# Patient Record
Sex: Male | Born: 1995 | Race: Black or African American | Hispanic: No | Marital: Single
Health system: Southern US, Community
[De-identification: ages and names within clinical notes are randomized; demographics above are authoritative.]

---

## 2016-12-28 ENCOUNTER — Emergency Department (HOSPITAL_COMMUNITY): Payer: Self-pay

## 2016-12-28 ENCOUNTER — Emergency Department (HOSPITAL_COMMUNITY)
Admission: EM | Admit: 2016-12-28 | Discharge: 2016-12-28 | Disposition: A | Discharge status: Home / Self Care | Payer: Self-pay | Attending: Emergency Medicine | Admitting: Emergency Medicine

## 2016-12-28 ENCOUNTER — Encounter (HOSPITAL_COMMUNITY): Payer: Self-pay

## 2016-12-28 DIAGNOSIS — Y939 Activity, unspecified: Secondary | ICD-10-CM | POA: Insufficient documentation

## 2016-12-28 DIAGNOSIS — W540XXA Bitten by dog, initial encounter: Secondary | ICD-10-CM | POA: Insufficient documentation

## 2016-12-28 DIAGNOSIS — Y929 Unspecified place or not applicable: Secondary | ICD-10-CM | POA: Insufficient documentation

## 2016-12-28 DIAGNOSIS — Y999 Unspecified external cause status: Secondary | ICD-10-CM | POA: Insufficient documentation

## 2016-12-28 DIAGNOSIS — S51852A Open bite of left forearm, initial encounter: Secondary | ICD-10-CM | POA: Insufficient documentation

## 2016-12-28 MED ORDER — HYDROCODONE-ACETAMINOPHEN 5-325 MG PO TABS
1.0000 | ORAL_TABLET | Freq: Once | ORAL | Status: AC
  Administered 2016-12-28: 1 via ORAL
  Filled 2016-12-28: qty 1

## 2016-12-28 MED ORDER — HYDROCODONE-ACETAMINOPHEN 5-325 MG PO TABS: 1.0000 | ORAL_TABLET | ORAL | 0 refills | Status: AC | PRN

## 2016-12-28 MED ORDER — AMOXICILLIN-POT CLAVULANATE 875-125 MG PO TABS: 1.0000 | ORAL_TABLET | Freq: Two times a day (BID) | ORAL | 0 refills | Status: AC

## 2016-12-28 MED ORDER — IBUPROFEN 600 MG PO TABS: 600.0000 mg | ORAL_TABLET | Freq: Four times a day (QID) | ORAL | 0 refills | Status: AC | PRN

## 2016-12-28 MED ORDER — AMOXICILLIN-POT CLAVULANATE 875-125 MG PO TABS
1.0000 | ORAL_TABLET | Freq: Once | ORAL | Status: AC
  Administered 2016-12-28: 1 via ORAL
  Filled 2016-12-28: qty 1

## 2016-12-28 NOTE — ED Notes (Signed)
Animal control contacted.  

## 2016-12-28 NOTE — Discharge Instructions (Signed)
FOLLOW UP WITH [facility] IN 2 DAYS FOR WOUND RECHECK. IF YOU DEVELOP A FEVER, SEVERE PAIN, HAVE DRAINAGE FROM ANY OF THE WOUNDS, RETURN TO [facility] FOR FURTHER TREATMENT. IT IS VERY IMPORTANT TO TAKE THE ANTIBIOTIC AS PRESCRIBED TO PREVENT INFECTION. TAKE NORCO FOR SEVERE PAIN AND IBUPROFEN FOR MILD TO MODERATE PAIN.

## 2016-12-28 NOTE — ED Provider Notes (Signed)
[facility] Provider Note   CSN: [id number] Arrival date & time: 12/28/16  1926     History   Chief Complaint Chief Complaint  Patient presents with  . Animal Bite    HPI Vincent Moore is a 21 y.o. male.  Otherwise healthy patient presents with wounds to left forearm after being bitten by a pit bull earlier today. He reports the dog belongs to a friend and was an unprovoked attack. The dog is thought to be up-to-date on immunizations including rabies.    The history is provided by the patient. No language interpreter was used.  Animal Bite  Associated symptoms: no fever and no numbness     History reviewed. No pertinent past medical history.  There are no active problems to display for this patient.   History reviewed. No pertinent surgical history.     Home Medications    Prior to Admission medications   Not on File    Family History History reviewed. No pertinent family history.  Social History Social History  Substance Use Topics  . Smoking status: Not on file  . Smokeless tobacco: Not on file  . Alcohol use Not on file     Allergies   Patient has no known allergies.   Review of Systems Review of Systems  Constitutional: Negative for fever.  Gastrointestinal: Negative for nausea.  Musculoskeletal:       HPI.  Skin: Positive for wound.  Neurological: Negative for numbness.     Physical Exam Updated Vital Signs BP 156/71   Pulse 90   Temp 98.7 F (37.1 C)   Resp 22   Ht 5\' 7"  (1.702 m)   Wt 72.6 kg   SpO2 99%   BMI 25.06 kg/m   Physical Exam  Constitutional: He is oriented to person, place, and time. He appears well-developed and well-nourished. No distress.  Cardiovascular: Normal rate.   Pulmonary/Chest: Effort normal.  Musculoskeletal:  FROM right UE including digits limited by pain. Large hematoma volar muscle belly mid-forearm, left.   Neurological: He is alert and oriented to person, place, and time.  Skin:    There are multiple puncture wounds to volar and dorsal aspects mid-forearm, left.      ED Treatments / Results  Labs (all labs ordered are listed, but only abnormal results are displayed) Labs Reviewed - No data to display  EKG  EKG Interpretation None       Radiology Dg Forearm Left  Result Date: 12/28/2016 CLINICAL DATA:  Pit bull attack. EXAM: LEFT FOREARM - 2 VIEW COMPARISON:  None. FINDINGS: Soft tissue swelling. Air consistent with history of dog bite/lacerations. No foreign bodies are identified. No fractures. IMPRESSION: Soft tissue abnormalities consistent with dog bites/lacerations. No foreign bodies or fractures. Electronically Signed   By: Gerome Sam III M.D   On: 12/28/2016 20:02    Procedures Procedures (including critical care time)  Medications Ordered in ED Medications  amoxicillin-clavulanate (AUGMENTIN) 875-125 MG per tablet 1 tablet (not administered)  HYDROcodone-acetaminophen (NORCO/VICODIN) 5-325 MG per tablet 1 tablet (not administered)     Initial Impression / Assessment and Plan / ED Course  I have reviewed the triage vital signs and the nursing notes.  Pertinent labs & imaging results that were available during my care of the patient were reviewed by me and considered in my medical decision making (see chart for details).  Clinical Course     Dog mite to left forearm with multiple puncture wounds without open lacerations. Arm is  cleaned and dressed. He is UTD on tetanus. The owner of the dog is present in ED and believes the dog to have had rabies. Animal Control will be notified for protocol monitoring of the dog. The patient is started on Augmentin and can be discharged home. Recommended 2-day recheck at Urgent Care or in the ED. Return precautions discussed.   Final Clinical Impressions(s) / ED Diagnoses   Final diagnoses:  None   1. Dog bite, left UE  New Prescriptions New Prescriptions   No medications on file     Danne HarborShari  Ramari Bray, PA-C 12/28/16 2137    Linwood DibblesJon Knapp, MD 12/29/16 [phone]

## 2016-12-28 NOTE — ED Triage Notes (Signed)
Pt here for dog bite to left forearm. Bleeding noted but controlled. Pulses present. Pms intact.

## 2017-08-04 IMAGING — DX DG FOREARM 2V*L*
2 series · 2 of 2 positions shown · non-contrast
Comparison: None.

CLINICAL DATA: Pit bull attack.

EXAM:
LEFT FOREARM - 2 VIEW

[forearm ap]
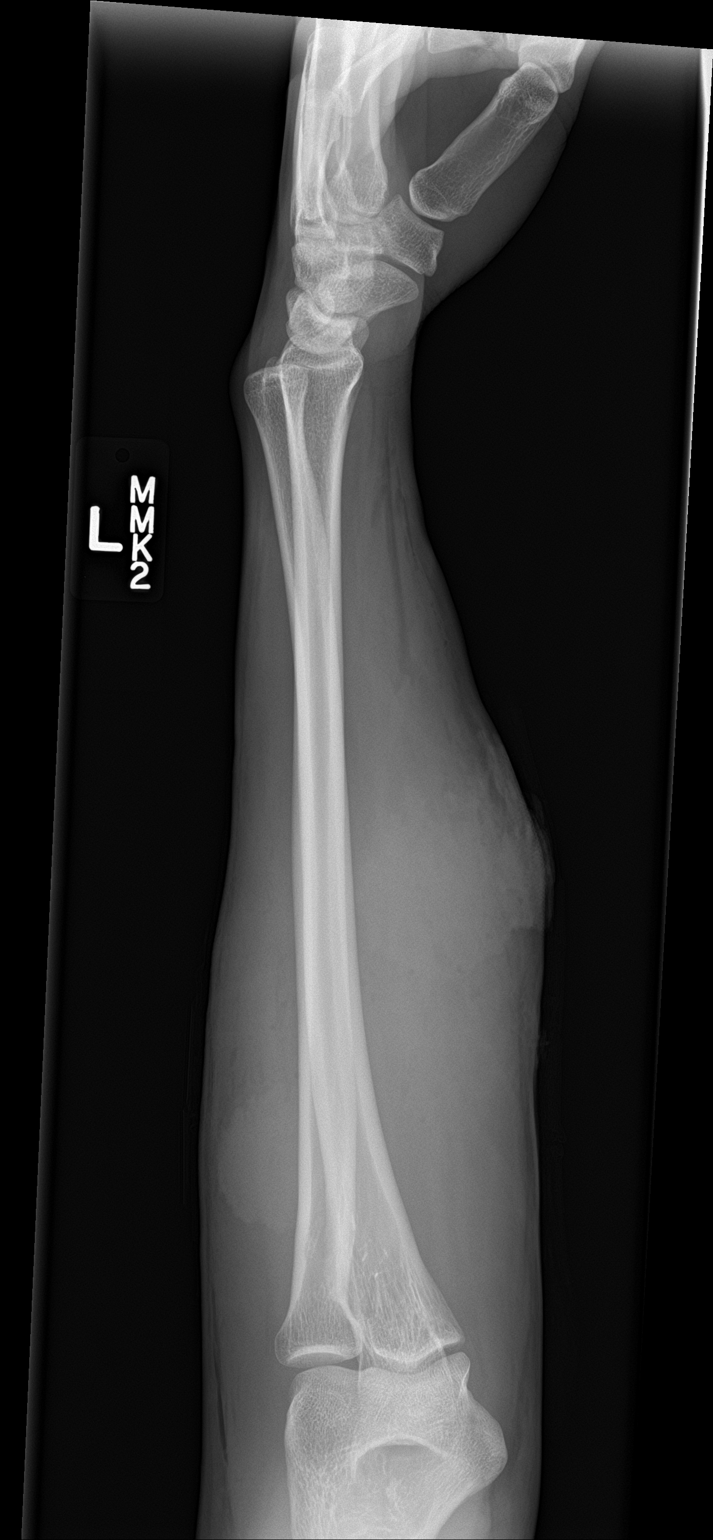

[forearm lat]
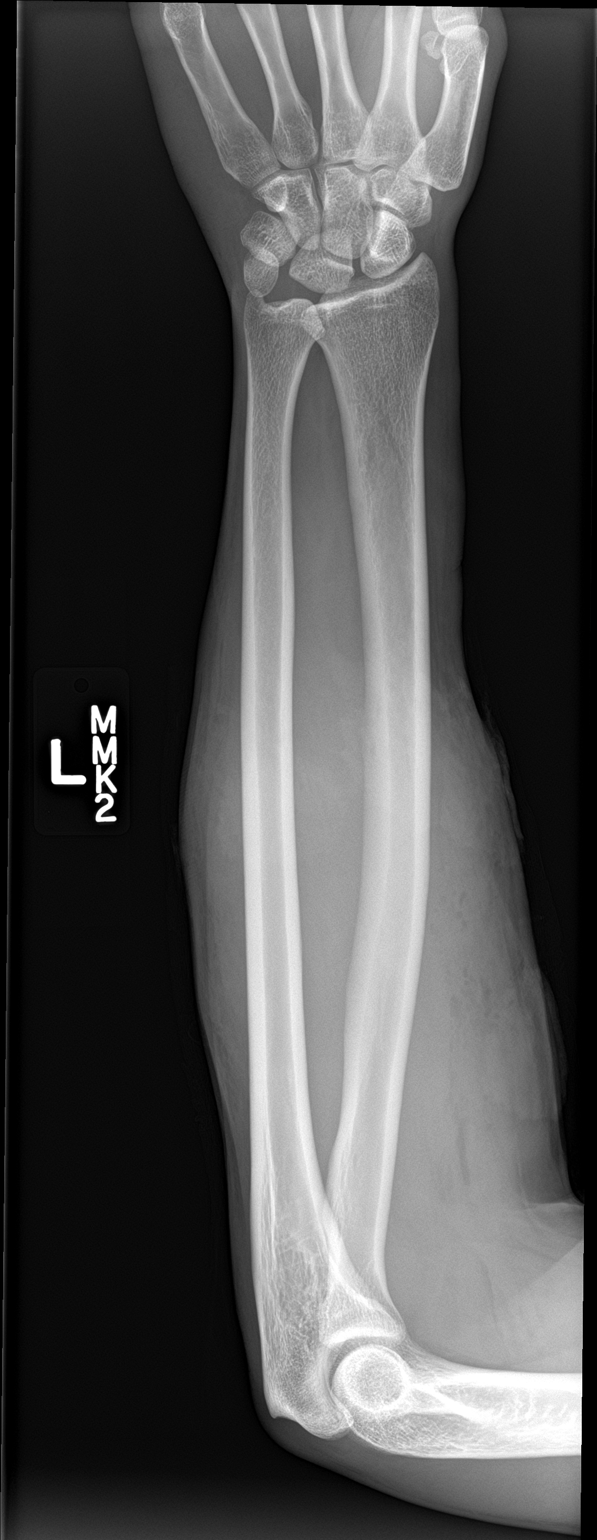

[2 of 2 positions shown; findings below may reference images not displayed]

FINDINGS: Soft tissue swelling. Air consistent with history of dog
bite/lacerations. No foreign bodies are identified. No fractures.
IMPRESSION: Soft tissue abnormalities consistent with dog bites/lacerations. No
foreign bodies or fractures.

## 2022-06-04 ENCOUNTER — Ambulatory Visit: Payer: Self-pay

## 2022-07-25 ENCOUNTER — Ambulatory Visit: Payer: Self-pay
